# Patient Record
Sex: Female | Born: 1960 | Race: White | Hispanic: No | Marital: Married | Smoking: Never smoker
Health system: Southern US, Community
[De-identification: ages and names within clinical notes are randomized; demographics above are authoritative.]

## PROBLEM LIST (undated history)

## (undated) DIAGNOSIS — I10 Essential (primary) hypertension: Secondary | ICD-10-CM

## (undated) HISTORY — PX: TUBAL LIGATION: SHX77

## (undated) HISTORY — PX: CHOLECYSTECTOMY: SHX55

## (undated) HISTORY — PX: ABDOMINAL HYSTERECTOMY: SHX81

---

## 2001-12-03 DIAGNOSIS — D229 Melanocytic nevi, unspecified: Secondary | ICD-10-CM

## 2001-12-03 HISTORY — DX: Melanocytic nevi, unspecified: D22.9

## 2006-02-17 ENCOUNTER — Ambulatory Visit: Payer: Self-pay | Admitting: Cardiology

## 2006-02-26 ENCOUNTER — Ambulatory Visit: Payer: Self-pay | Admitting: Cardiology

## 2006-03-06 ENCOUNTER — Ambulatory Visit: Payer: Self-pay | Admitting: Cardiology

## 2008-07-19 ENCOUNTER — Ambulatory Visit (HOSPITAL_COMMUNITY): Admission: RE | Admit: 2008-07-19 | Discharge: 2008-07-19 | Payer: Self-pay | Admitting: Family Medicine

## 2009-08-08 ENCOUNTER — Telehealth (INDEPENDENT_AMBULATORY_CARE_PROVIDER_SITE_OTHER): Payer: Self-pay | Admitting: *Deleted

## 2009-11-15 ENCOUNTER — Ambulatory Visit (HOSPITAL_COMMUNITY): Admission: RE | Admit: 2009-11-15 | Discharge: 2009-11-15 | Payer: Self-pay | Admitting: Family Medicine

## 2010-07-10 NOTE — Progress Notes (Signed)
  recieved Request for records from [facility] forwarded to [facility]  August 08, 2009 9:28 AM

## 2010-11-21 ENCOUNTER — Other Ambulatory Visit (HOSPITAL_COMMUNITY): Payer: Self-pay | Admitting: Family Medicine

## 2010-11-21 DIAGNOSIS — Z139 Encounter for screening, unspecified: Secondary | ICD-10-CM

## 2010-11-30 ENCOUNTER — Ambulatory Visit (HOSPITAL_COMMUNITY): Payer: Self-pay

## 2010-12-07 ENCOUNTER — Ambulatory Visit (HOSPITAL_COMMUNITY)
Admission: RE | Admit: 2010-12-07 | Discharge: 2010-12-07 | Disposition: A | Discharge status: Home / Self Care | Payer: BC Managed Care – PPO | Source: Ambulatory Visit | Attending: Family Medicine | Admitting: Family Medicine

## 2010-12-07 DIAGNOSIS — Z1231 Encounter for screening mammogram for malignant neoplasm of breast: Secondary | ICD-10-CM | POA: Insufficient documentation

## 2010-12-07 DIAGNOSIS — Z139 Encounter for screening, unspecified: Secondary | ICD-10-CM

## 2011-12-09 ENCOUNTER — Other Ambulatory Visit (HOSPITAL_COMMUNITY): Payer: Self-pay | Admitting: Family Medicine

## 2011-12-09 DIAGNOSIS — Z139 Encounter for screening, unspecified: Secondary | ICD-10-CM

## 2011-12-20 ENCOUNTER — Ambulatory Visit (HOSPITAL_COMMUNITY)
Admission: RE | Admit: 2011-12-20 | Discharge: 2011-12-20 | Disposition: A | Discharge status: Home / Self Care | Payer: BC Managed Care – PPO | Source: Ambulatory Visit | Attending: Family Medicine | Admitting: Family Medicine

## 2011-12-20 DIAGNOSIS — Z139 Encounter for screening, unspecified: Secondary | ICD-10-CM

## 2011-12-20 DIAGNOSIS — Z1231 Encounter for screening mammogram for malignant neoplasm of breast: Secondary | ICD-10-CM | POA: Insufficient documentation

## 2012-03-31 ENCOUNTER — Ambulatory Visit: Payer: BC Managed Care – PPO | Admitting: Sports Medicine

## 2012-04-01 ENCOUNTER — Ambulatory Visit (INDEPENDENT_AMBULATORY_CARE_PROVIDER_SITE_OTHER): Payer: BC Managed Care – PPO | Admitting: Sports Medicine

## 2012-04-01 ENCOUNTER — Ambulatory Visit
Admission: RE | Admit: 2012-04-01 | Discharge: 2012-04-01 | Disposition: A | Discharge status: Home / Self Care | Payer: BC Managed Care – PPO | Source: Ambulatory Visit | Attending: Sports Medicine | Admitting: Sports Medicine

## 2012-04-01 ENCOUNTER — Encounter: Payer: Self-pay | Admitting: Sports Medicine

## 2012-04-01 VITALS — BP 161/91 | HR 74

## 2012-04-01 DIAGNOSIS — M542 Cervicalgia: Secondary | ICD-10-CM

## 2012-04-01 MED ORDER — GABAPENTIN 300 MG PO CAPS: 300.0000 mg | ORAL_CAPSULE | Freq: Every day | ORAL | Status: DC

## 2012-04-01 MED ORDER — TRAMADOL HCL 50 MG PO TABS: 50.0000 mg | ORAL_TABLET | Freq: Three times a day (TID) | ORAL | Status: DC | PRN

## 2012-04-01 NOTE — Progress Notes (Signed)
Patient ID: Kathleen Walter, female   DOB: 08/18/1960, 51 y.o.   MRN: [id number]  SUBJECTIVE: #1 - neck pain Kathleen Walter is a 51 y.o. female with PMHx of lumbar disc herniation with chief complaint of neck pain that started in mid-September at RIGHT shoulder blade, posterior neck.  After three days of pain she was given a IM gluteal injection with unknown substance, sure of 80mg  amount ( Steorid?) by PA she works with.  Pain free and improved following this injection until approx 5 days ago (last Saturday).  Since return of pain she has been using heating pad, icy-hot ointment as well as 800mg  ibuprofen qhs and 2 alleve q AM to help alleviate pain.  Pt is able to sleep at night and feels okay when she wakes up.  However, throughout the day her pain worsens; pt reports radiation of a "dull ache" sensation down posterior arm to olecranon on RIGHT.    #2 - Low back pain  6-7 year history of LBP with MRI completed previously @ [facility] which revealed herniated disc at unknown level, per patient's recollection.  Pt had spinal facet injection approx 6-7 years ago which provided some relief.  Pt reports some LBP in right paraspinal muscles, especially in mornings or after prolonged sitting/supine position.  Denies any red flag symptoms for cauda equina.  Denies increased symptoms or radiculopathy with valsalva.  Reports some radiation down right leg to heel going through right gluteus.   OBJECTIVE: Vital signs as noted above. Appearance: Well appearing female in no acute distress, appears stated age Neck/Shoulder exam: AROM and PROM is symmetric and appropriate in the neck (flexion/extion, side-bending, rotation) and shoulders (flexion/extension, IR, ER, abduction).  However, patient experiences pain with active neck flexion and side bending to the left localized to T2 area, lower cervical spine.   Pt with 5/5 strength bilaterally in abduction and sensation is grossly intact over C5  distribution.  Pt has 5/5 strength bilaterally in all major myotomes of cervical and thoracic spine as well Spasm appreciated in Right paraspinal musculature from level of C6-T3 with exquisite point tenderness at approx level of T2 on RIGHT Negative spurling's test on right Spurling's test on left (in 10 degrees extension, sidebending and rotation) reproduced pain/radiation with symptoms to deltoid but not completley to olecranon.  Low Back exam: Pt demonstrates good strength of L4, S1 nerve roots by walking on heels and toes across room.  Tandem gait is intact as well. 5/5 strength in lower extremity.  2/4 DTR of b/l achilles, patellar reflexes. Straight leg raise negative bilaterally to 70 degrees   X-ray: None new, one ordered for today  ASSESSMENT: Right sided neck pain with radiation Low Back Pain  PLAN: Pt with history and exam above demonstrate chronic low back pain, likely secondary to disc pathology per patient report.  This is stable and unchanged, measures used for cervical spine will provide relief for lumbar pain as well. Exam and history suspicious for cervical spinal nerve impingent on RIGHT side with distribution per symptoms correlating with C5 nerve root.  Strength 5/5, sensation grossly intact but reproduction of some radiation with slight pressure during Spurling's exam (looking to left). Plan for cervical spine x-ray to evaluate for impingement of C-spinal nerve(s) coming out of vertebral foramen.  Will treat pain with gabapentin 300mg  qHS and tramadol 50mg  qHS prn to assist with sleep.  Pt provided with and instructed to use C-spine collar at home, following work to alleviate stress on  cervical spine from weight of head.  Pt will be contacted back with X-ray results, she went following appointment, and follow-up in 1 month for re-evaluation and response to therapy.  -- Gerilyn Pilgrim bright, MS IV

## 2012-04-02 DIAGNOSIS — M542 Cervicalgia: Secondary | ICD-10-CM | POA: Insufficient documentation

## 2012-04-02 NOTE — Assessment & Plan Note (Signed)
We will evaluate cervical spine films  Use a cervical collar for rest  Medications to lessen pain and radicular symptoms  Recheck in one month but consider MRI if not improving

## 2012-04-29 ENCOUNTER — Ambulatory Visit (INDEPENDENT_AMBULATORY_CARE_PROVIDER_SITE_OTHER): Payer: BC Managed Care – PPO | Admitting: Sports Medicine

## 2012-04-29 VITALS — BP 138/80 | Ht 66.0 in | Wt 189.0 lb

## 2012-04-29 DIAGNOSIS — M999 Biomechanical lesion, unspecified: Secondary | ICD-10-CM | POA: Insufficient documentation

## 2012-04-29 DIAGNOSIS — M9981 Other biomechanical lesions of cervical region: Secondary | ICD-10-CM

## 2012-04-29 DIAGNOSIS — M542 Cervicalgia: Secondary | ICD-10-CM

## 2012-04-29 MED ORDER — GABAPENTIN 300 MG PO CAPS: 300.0000 mg | ORAL_CAPSULE | Freq: Every day | ORAL | Status: DC

## 2012-04-29 NOTE — Progress Notes (Signed)
History of present illness: Patient is here for followup of her neck pain. Patient was given some gabapentin and tramadol. Patient states that the tramadol gave her interesting side effects and caused her to actually have severe back pain. Patient stopped this in her back pain went away.  Patient though states that the gabapentin causes no side effects and she is tolerating it well and has no symptoms at this time. Patient states that she has not felt this good in her neck for a couple of years. patient denies any radiation in her hands or any weakness of her upper extremities. Patient is sleeping through the night and no nighttime awakening secondary to pain.  Patient did have x-ray of the neck done which did show some degenerative disc disease as well as some osteophyte formation mostly at the C4-5 region. These were reviewed by me today.  Past medical, surgical and family history reviewed without any changes  Review of systems as stated above in history of present illness  Physical exam Blood pressure 138/80, height 5\' 6"  (1.676 m), weight 189 lb (85.73 kg). General: No apparent distress alert and oriented x3 mood and affect somewhat normal but blunted. Respiratory: Patient's recent full sentences and does not appear short of breath Skin: Warm dry intact with no signs of infection or rash Neuro: Cranial nerves II through XII are intact, neurovascularly intact in all extremities with 2+ DTRs and 2+ pulses. Neck: Negative spurling's Full neck range of motion Grip strength and sensation normal in bilateral hands Strength good C4 to T1 distribution No sensory change to C4 to T1 Reflexes normal  Osteopathic findings C4 flexed rotated and side bent right T5 extended rotated and side bent left L2 flexed rotated and side bent right

## 2012-04-29 NOTE — Assessment & Plan Note (Signed)
Decision today to treat with OMT was based on Physical Exam  After verbal consent patient was treated with ME and articular techniques in cervical and thoracic areas  Patient tolerated the procedure well with improvement in symptoms  Patient given exercises, stretches and lifestyle modifications  See medications in patient instructions if given  Patient will follow up in 4-6  Weeks if beneficial

## 2012-04-29 NOTE — Patient Instructions (Signed)
Well good to see you I have refilled gabapentin and a 90 day supply.  If you need more of the gabapentin or you add a daytime dose please call. Continue doing the exercises.   At this time only come back to see [place] if you need [place].

## 2012-04-29 NOTE — Assessment & Plan Note (Addendum)
Patient appears to be doing very well just with Neurontin. At this time we'll not do any further imaging or any changes in treatment option with patient being very happy with her treatment so far. Patient is warned of red flags and when to seek medical attention and will come back on an as-needed basis.  Patient will continue to take the Neurontin as long as she does not have side effects. Patient is told though if she does start having some pain she can attempt to go up to Neurontin 2 times a day but she will give [place] a call if she does this.  Patient will come back once again on an as needed basis. She did respond to some muscle energy for her nonallopathic findings today.

## 2013-03-30 ENCOUNTER — Other Ambulatory Visit (HOSPITAL_COMMUNITY): Payer: Self-pay | Admitting: Physician Assistant

## 2013-03-30 DIAGNOSIS — Z139 Encounter for screening, unspecified: Secondary | ICD-10-CM

## 2013-04-06 ENCOUNTER — Ambulatory Visit (HOSPITAL_COMMUNITY)
Admission: RE | Admit: 2013-04-06 | Discharge: 2013-04-06 | Disposition: A | Discharge status: Home / Self Care | Payer: BC Managed Care – PPO | Source: Ambulatory Visit | Attending: Physician Assistant | Admitting: Physician Assistant

## 2013-04-06 DIAGNOSIS — Z139 Encounter for screening, unspecified: Secondary | ICD-10-CM

## 2013-04-06 DIAGNOSIS — Z1231 Encounter for screening mammogram for malignant neoplasm of breast: Secondary | ICD-10-CM | POA: Insufficient documentation

## 2013-04-08 ENCOUNTER — Telehealth (INDEPENDENT_AMBULATORY_CARE_PROVIDER_SITE_OTHER): Payer: Self-pay | Admitting: *Deleted

## 2013-04-08 NOTE — Telephone Encounter (Signed)
  Procedure: tcs  Reason/Indication:  screening  Has patient had this procedure before?  no  If so, when, by whom and where?    Is there a family history of colon cancer?  no  Who?  What age when diagnosed?    Is patient diabetic?   no      Does patient have prosthetic heart valve?  no  Do you have a pacemaker?  no  Has patient ever had endocarditis? no  Has patient had joint replacement within last 12 months?  no  Does patient tend to be constipated or take laxatives? no  Is patient on Coumadin, Plavix and/or Aspirin? no  Medications: tricor 145 mg daily, ramipril 5 mg daily  Allergies: sulfur drugs (major rash & swelling)  Medication Adjustment:   Procedure date & time:

## 2013-06-15 ENCOUNTER — Encounter (INDEPENDENT_AMBULATORY_CARE_PROVIDER_SITE_OTHER): Payer: Self-pay | Admitting: *Deleted

## 2014-06-22 ENCOUNTER — Other Ambulatory Visit (HOSPITAL_COMMUNITY): Payer: Self-pay | Admitting: Physician Assistant

## 2014-06-22 DIAGNOSIS — Z1231 Encounter for screening mammogram for malignant neoplasm of breast: Secondary | ICD-10-CM

## 2014-08-10 ENCOUNTER — Ambulatory Visit (HOSPITAL_COMMUNITY)
Admission: RE | Admit: 2014-08-10 | Discharge: 2014-08-10 | Disposition: A | Discharge status: Home / Self Care | Payer: BLUE CROSS/BLUE SHIELD | Source: Ambulatory Visit | Attending: Physician Assistant | Admitting: Physician Assistant

## 2014-08-10 DIAGNOSIS — Z1231 Encounter for screening mammogram for malignant neoplasm of breast: Secondary | ICD-10-CM | POA: Insufficient documentation

## 2015-09-20 ENCOUNTER — Other Ambulatory Visit (HOSPITAL_COMMUNITY): Payer: Self-pay | Admitting: Physician Assistant

## 2015-09-20 DIAGNOSIS — Z1231 Encounter for screening mammogram for malignant neoplasm of breast: Secondary | ICD-10-CM

## 2015-10-11 ENCOUNTER — Ambulatory Visit (HOSPITAL_COMMUNITY)
Admission: RE | Admit: 2015-10-11 | Discharge: 2015-10-11 | Disposition: A | Discharge status: Home / Self Care | Payer: BLUE CROSS/BLUE SHIELD | Source: Ambulatory Visit | Attending: Physician Assistant | Admitting: Physician Assistant

## 2015-10-11 DIAGNOSIS — Z1231 Encounter for screening mammogram for malignant neoplasm of breast: Secondary | ICD-10-CM | POA: Insufficient documentation

## 2015-10-16 ENCOUNTER — Encounter (INDEPENDENT_AMBULATORY_CARE_PROVIDER_SITE_OTHER): Payer: Self-pay | Admitting: *Deleted

## 2015-10-26 ENCOUNTER — Other Ambulatory Visit (INDEPENDENT_AMBULATORY_CARE_PROVIDER_SITE_OTHER): Payer: Self-pay | Admitting: *Deleted

## 2015-10-26 DIAGNOSIS — Z800 Family history of malignant neoplasm of digestive organs: Secondary | ICD-10-CM

## 2015-10-26 DIAGNOSIS — Z1211 Encounter for screening for malignant neoplasm of colon: Secondary | ICD-10-CM

## 2015-12-15 ENCOUNTER — Encounter (INDEPENDENT_AMBULATORY_CARE_PROVIDER_SITE_OTHER): Payer: Self-pay | Admitting: *Deleted

## 2015-12-15 ENCOUNTER — Other Ambulatory Visit (INDEPENDENT_AMBULATORY_CARE_PROVIDER_SITE_OTHER): Payer: Self-pay | Admitting: *Deleted

## 2015-12-15 NOTE — Telephone Encounter (Signed)
Patient needs trilyte 

## 2015-12-18 MED ORDER — PEG 3350-KCL-NA BICARB-NACL 420 G PO SOLR: 4000.0000 mL | Freq: Once | ORAL | Status: DC

## 2016-01-17 ENCOUNTER — Telehealth (INDEPENDENT_AMBULATORY_CARE_PROVIDER_SITE_OTHER): Payer: Self-pay | Admitting: *Deleted

## 2016-01-17 NOTE — Telephone Encounter (Signed)
Referring MD/PCP: daniel   Procedure: tcs  Reason/Indication:  Screening, fam hx colon ca  Has patient had this procedure before?  no  If so, when, by whom and where?    Is there a family history of colon cancer?  Yes, great aunt  Who?  What age when diagnosed?    Is patient diabetic?   no      Does patient have prosthetic heart valve or mechanical valve?  no  Do you have a pacemaker?  no  Has patient ever had endocarditis? no  Has patient had joint replacement within last 12 months?  no  Does patient tend to be constipated or take laxatives? no  Does patient have a history of alcohol/drug use?  no  Is patient on Coumadin, Plavix and/or Aspirin? no  Medications: lisinopril/hctz daily  Allergies: sulfur drugs  Medication Adjustment:   Procedure date & time: 02/07/16 at 930

## 2016-01-24 NOTE — Telephone Encounter (Signed)
agree

## 2016-02-07 ENCOUNTER — Encounter (HOSPITAL_COMMUNITY): Payer: Self-pay | Admitting: *Deleted

## 2016-02-07 ENCOUNTER — Encounter (HOSPITAL_COMMUNITY)
Admission: RE | Disposition: A | Discharge status: Home / Self Care | Payer: Self-pay | Source: Ambulatory Visit | Attending: Internal Medicine

## 2016-02-07 ENCOUNTER — Ambulatory Visit (HOSPITAL_COMMUNITY)
Admission: RE | Admit: 2016-02-07 | Discharge: 2016-02-07 | Disposition: A | Discharge status: Home / Self Care | Payer: BLUE CROSS/BLUE SHIELD | Source: Ambulatory Visit | Attending: Internal Medicine | Admitting: Internal Medicine

## 2016-02-07 DIAGNOSIS — I10 Essential (primary) hypertension: Secondary | ICD-10-CM | POA: Insufficient documentation

## 2016-02-07 DIAGNOSIS — Z800 Family history of malignant neoplasm of digestive organs: Secondary | ICD-10-CM

## 2016-02-07 DIAGNOSIS — K5730 Diverticulosis of large intestine without perforation or abscess without bleeding: Secondary | ICD-10-CM | POA: Diagnosis not present

## 2016-02-07 DIAGNOSIS — K644 Residual hemorrhoidal skin tags: Secondary | ICD-10-CM | POA: Insufficient documentation

## 2016-02-07 DIAGNOSIS — Z1211 Encounter for screening for malignant neoplasm of colon: Secondary | ICD-10-CM | POA: Insufficient documentation

## 2016-02-07 HISTORY — PX: COLONOSCOPY: SHX5424

## 2016-02-07 HISTORY — DX: Essential (primary) hypertension: I10

## 2016-02-07 SURGERY — COLONOSCOPY
Anesthesia: Moderate Sedation

## 2016-02-07 MED ORDER — SODIUM CHLORIDE 0.9 % IV SOLN
INTRAVENOUS | Status: DC
  Administered 2016-02-07: 09:00:00 via INTRAVENOUS

## 2016-02-07 MED ORDER — MEPERIDINE HCL 50 MG/ML IJ SOLN
INTRAMUSCULAR | Status: DC | PRN
  Administered 2016-02-07 (×2): 25 mg via INTRAVENOUS

## 2016-02-07 MED ORDER — MIDAZOLAM HCL 5 MG/5ML IJ SOLN
INTRAMUSCULAR | Status: AC
  Filled 2016-02-07: qty 10

## 2016-02-07 MED ORDER — SIMETHICONE 40 MG/0.6ML PO SUSP
ORAL | Status: DC | PRN
  Administered 2016-02-07: 2.5 mL

## 2016-02-07 MED ORDER — DICYCLOMINE HCL 10 MG PO CAPS: 10.0000 mg | ORAL_CAPSULE | Freq: Three times a day (TID) | ORAL | 2 refills | Status: DC | PRN

## 2016-02-07 MED ORDER — MEPERIDINE HCL 50 MG/ML IJ SOLN
INTRAMUSCULAR | Status: AC
  Filled 2016-02-07: qty 1

## 2016-02-07 MED ORDER — MIDAZOLAM HCL 5 MG/5ML IJ SOLN
INTRAMUSCULAR | Status: DC | PRN
  Administered 2016-02-07: 2 mg via INTRAVENOUS
  Administered 2016-02-07: 1 mg via INTRAVENOUS
  Administered 2016-02-07 (×3): 2 mg via INTRAVENOUS

## 2016-02-07 NOTE — Op Note (Signed)
[facility] Patient Name: Kathleen Walter Procedure Date: 02/07/2016 9:17 AM MRN: [id number] Date of Birth: [birth year] Attending MD: Hildred Laser , MD CSN: [id number] Age: 55 Admit Type: Outpatient Procedure:                Colonoscopy Indications:              Screening for colorectal malignant neoplasm Providers:                Hildred Laser, MD, Lurline Del, RN, Purcell Nails. [place],                            Merchant navy officer Referring MD:             Tawni Carnes, PA Medicines:                Meperidine 50 mg IV, Midazolam 9 mg IV Complications:            No immediate complications. Estimated Blood Loss:     Estimated blood loss: none. Procedure:                Pre-Anesthesia Assessment:                           - Prior to the procedure, a History and Physical                            was performed, and patient medications and                            allergies were reviewed. The patient's tolerance of                            previous anesthesia was also reviewed. The risks                            and benefits of the procedure and the sedation                            options and risks were discussed with the patient.                            All questions were answered, and informed consent                            was obtained. Prior Anticoagulants: The patient                            last took ibuprofen 14 days prior to the procedure.                            ASA Grade Assessment: I - A normal, healthy                            patient. After reviewing the risks and benefits,  the patient was deemed in satisfactory condition to                            undergo the procedure.                           After obtaining informed consent, the colonoscope                            was passed under direct vision. Throughout the                            procedure, the patient's blood pressure, pulse, and   oxygen saturations were monitored continuously. The                            [id number]) was introduced through the                            anus and advanced to the the cecum, identified by                            appendiceal orifice and ileocecal valve. The                            colonoscopy was somewhat difficult due to                            significant looping. Successful completion of the                            procedure was aided by changing the patient to a                            supine position. The patient tolerated the                            procedure well. The quality of the bowel                            preparation was good. The ileocecal valve,                            appendiceal orifice, and rectum were photographed. Scope In: 9:33:54 AM Scope Out: 10:01:27 AM Scope Withdrawal Time: 0 hours 7 minutes 34 seconds  Total Procedure Duration: 0 hours 27 minutes 33 seconds  Findings:      Scattered medium-mouthed diverticula were found in the sigmoid colon and       hepatic flexure.      External hemorrhoids were found during retroflexion. The hemorrhoids       were small. Impression:               - Diverticulosis in the sigmoid colon and at the  hepatic flexure.                           - External hemorrhoids.                           - No specimens collected. Moderate Sedation:      Moderate (conscious) sedation was administered by the endoscopy nurse       and supervised by the endoscopist. The following parameters were       monitored: oxygen saturation, heart rate, blood pressure, CO2       capnography and response to care. Total physician intraservice time was       35 minutes. Recommendation:           - Patient has a contact number available for                            emergencies. The signs and symptoms of potential                            delayed complications were discussed with the                             patient. Return to normal activities tomorrow.                            Written discharge instructions were provided to the                            patient.                           - High fiber diet today.                           - Continue present medications.                           - Repeat colonoscopy in 10 years for screening                            purposes. Procedure Code(s):        --- Professional ---                           [phone], Colonoscopy, flexible; diagnostic, including                            collection of specimen(s) by brushing or washing,                            when performed (separate procedure)                           99152, Moderate sedation services provided by the  same physician or other qualified health care                            professional performing the diagnostic or                            therapeutic service that the sedation supports,                            requiring the presence of an independent trained                            observer to assist in the monitoring of the                            patient's level of consciousness and physiological                            status; initial 15 minutes of intraservice time,                            patient age 9 years or older                           [id number], Moderate sedation services; each additional                            15 minutes intraservice time Diagnosis Code(s):        --- Professional ---                           Z12.11, Encounter for screening for malignant                            neoplasm of colon                           K64.4, Residual hemorrhoidal skin tags                           K57.30, Diverticulosis of large intestine without                            perforation or abscess without bleeding CPT copyright 2016 American Medical Association. All rights reserved. The codes documented in this  report are preliminary and upon coder review may  be revised to meet current compliance requirements. Hildred Laser, MD Hildred Laser, MD 02/07/2016 10:14:48 AM This report has been signed electronically. Number of Addenda: 0

## 2016-02-07 NOTE — H&P (Signed)
Kathleen Walter is an 55 y.o. female.   Chief Complaint: Patient is here for colonoscopy. HPI: Patient is 55 year old Caucasian female who is here for screening colonoscopy. She denies abdominal pain change in bowel habits or rectal bleeding. Family history is negative for CRC.  Past Medical History:  Diagnosis Date  . Hypertension     Past Surgical History:  Procedure Laterality Date  . ABDOMINAL HYSTERECTOMY    . CHOLECYSTECTOMY    . TUBAL LIGATION      History reviewed. No pertinent family history. Social History:  reports that she has never smoked. She has never used smokeless tobacco. Her alcohol and drug histories are not on file.  Allergies:  Allergies  Allergen Reactions  . Sulfa Antibiotics Swelling and Rash    Medications Prior to Admission  Medication Sig Dispense Refill  . ibuprofen (ADVIL,MOTRIN) 200 MG tablet Take 600 mg by mouth every 6 (six) hours as needed for moderate pain.    Marland Kitchen lisinopril-hydrochlorothiazide (PRINZIDE,ZESTORETIC) 20-12.5 MG tablet Take 1 tablet by mouth daily.      No results found for this or any previous visit (from the past 48 hour(s)). No results found.  ROS  Blood pressure 126/75, pulse 100, temperature 97.9 F (36.6 C), temperature source Oral, resp. rate 12, height 5' 5.5" (1.664 m), weight 188 lb (85.3 kg), SpO2 100 %. Physical Exam  Constitutional: She appears well-developed and well-nourished.  HENT:  Mouth/Throat: Oropharynx is clear and moist.  Eyes: Conjunctivae are normal.  Neck: No thyromegaly present.  Cardiovascular: Normal rate, regular rhythm and normal heart sounds.   No murmur heard. Respiratory: Effort normal and breath sounds normal.  GI: Soft. She exhibits no distension and no mass. There is no tenderness.  Musculoskeletal: She exhibits no edema.  Lymphadenopathy:    She has no cervical adenopathy.  Neurological: She is alert.  Skin: Skin is warm and dry.     Assessment/Plan Average risk screening  colonoscopy.  Hildred Laser, MD 02/07/2016, 9:19 AM

## 2016-02-07 NOTE — Discharge Instructions (Signed)
Resume usual medications and high fiber diet. Dicyclomine 10 mg by mouth 30 minutes before each meal as needed. No driving for 24 hours. Next screening exam in 10 years.     Colonoscopy, Care After These instructions give you information on caring for yourself after your procedure. Your doctor may also give you more specific instructions. Call your doctor if you have any problems or questions after your procedure. HOME CARE  Do not drive for 24 hours.  Do not sign important papers or use machinery for 24 hours.  You may shower.  You may go back to your usual activities, but go slower for the first 24 hours.  Take rest breaks often during the first 24 hours.  Walk around or use warm packs on your belly (abdomen) if you have belly cramping or gas.  Drink enough fluids to keep your pee (urine) clear or pale yellow.  Resume your normal diet. Avoid heavy or fried foods.  Avoid drinking alcohol for 24 hours or as told by your doctor.  Only take medicines as told by your doctor. If a tissue sample (biopsy) was taken during the procedure:   Do not take aspirin or blood thinners for 7 days, or as told by your doctor.  Do not drink alcohol for 7 days, or as told by your doctor.  Eat soft foods for the first 24 hours. GET HELP IF: You still have a small amount of blood in your poop (stool) 2-3 days after the procedure. GET HELP RIGHT AWAY IF:  You have more than a small amount of blood in your poop.  You see clumps of tissue (blood clots) in your poop.  Your belly is puffy (swollen).  You feel sick to your stomach (nauseous) or throw up (vomit).  You have a fever.  You have belly pain that gets worse and medicine does not help. MAKE SURE YOU:  Understand these instructions.  Will watch your condition.  Will get help right away if you are not doing well or get worse.   This information is not intended to replace advice given to you by your health care provider. Make  sure you discuss any questions you have with your health care provider.   Document Released: 06/29/2010 Document Revised: 06/01/2013 Document Reviewed: 02/01/2013 Elsevier Interactive Patient Education 2016 Elsevier Inc.   High-Fiber Diet Fiber, also called dietary fiber, is a type of carbohydrate found in fruits, vegetables, whole grains, and beans. A high-fiber diet can have many health benefits. Your health care provider may recommend a high-fiber diet to help:  Prevent constipation. Fiber can make your bowel movements more regular.  Lower your cholesterol.  Relieve hemorrhoids, uncomplicated diverticulosis, or irritable bowel syndrome.  Prevent overeating as part of a weight-loss plan.  Prevent heart disease, type 2 diabetes, and certain cancers. WHAT IS MY PLAN? The recommended daily intake of fiber includes:  38 grams for men under age 60.  46 grams for men over age 64.  59 grams for women under age 67.  59 grams for women over age 61. You can get the recommended daily intake of dietary fiber by eating a variety of fruits, vegetables, grains, and beans. Your health care provider may also recommend a fiber supplement if it is not possible to get enough fiber through your diet. WHAT DO I NEED TO KNOW ABOUT A HIGH-FIBER DIET?  Fiber supplements have not been widely studied for their effectiveness, so it is better to get fiber through food sources.  Always check the fiber content on thenutrition facts label of any prepackaged food. Look for foods that contain at least 5 grams of fiber per serving.  Ask your dietitian if you have questions about specific foods that are related to your condition, especially if those foods are not listed in the following section.  Increase your daily fiber consumption gradually. Increasing your intake of dietary fiber too quickly may cause bloating, cramping, or gas.  Drink plenty of water. Water helps you to digest fiber. WHAT FOODS CAN I  EAT? Grains Whole-grain breads. Multigrain cereal. Oats and oatmeal. Brown rice. Barley. Bulgur wheat. [place]. Bran muffins. Popcorn. Rye wafer crackers. Vegetables Sweet potatoes. Spinach. Kale. Artichokes. Cabbage. Broccoli. Green peas. Carrots. Squash. Fruits Berries. Pears. Apples. Oranges. Avocados. Prunes and raisins. Dried figs. Meats and Other Protein Sources [place], kidney, pinto, and soy beans. Split peas. Lentils. Nuts and seeds. Dairy Fiber-fortified yogurt. Beverages Fiber-fortified soy milk. Fiber-fortified orange juice. Other Fiber bars. The items listed above may not be a complete list of recommended foods or beverages. Contact your dietitian for more options. WHAT FOODS ARE NOT RECOMMENDED? Grains White bread. Pasta made with refined flour. White rice. Vegetables Fried potatoes. Canned vegetables. Well-cooked vegetables.  Fruits Fruit juice. Cooked, strained fruit. Meats and Other Protein Sources Fatty cuts of meat. Fried Sales executive or fried fish. Dairy Milk. Yogurt. Cream cheese. Sour cream. Beverages Soft drinks. Other Cakes and pastries. Butter and oils. The items listed above may not be a complete list of foods and beverages to avoid. Contact your dietitian for more information. WHAT ARE SOME TIPS FOR INCLUDING HIGH-FIBER FOODS IN MY DIET?  Eat a wide variety of high-fiber foods.  Make sure that half of all grains consumed each day are whole grains.  Replace breads and cereals made from refined flour or white flour with whole-grain breads and cereals.  Replace white rice with brown rice, bulgur wheat, or millet.  Start the day with a breakfast that is high in fiber, such as a cereal that contains at least 5 grams of fiber per serving.  Use beans in place of meat in soups, salads, or pasta.  Eat high-fiber snacks, such as berries, raw vegetables, nuts, or popcorn.   This information is not intended to replace advice given to you by your health care  provider. Make sure you discuss any questions you have with your health care provider.   Document Released: 05/27/2005 Document Revised: 06/17/2014 Document Reviewed: 11/09/2013 Elsevier Interactive Patient Education [employer].

## 2016-02-13 ENCOUNTER — Encounter (HOSPITAL_COMMUNITY): Payer: Self-pay | Admitting: Internal Medicine

## 2016-12-04 ENCOUNTER — Other Ambulatory Visit (HOSPITAL_COMMUNITY): Payer: Self-pay | Admitting: Physician Assistant

## 2016-12-04 DIAGNOSIS — Z1231 Encounter for screening mammogram for malignant neoplasm of breast: Secondary | ICD-10-CM

## 2016-12-05 ENCOUNTER — Ambulatory Visit (HOSPITAL_COMMUNITY)
Admission: RE | Admit: 2016-12-05 | Discharge: 2016-12-05 | Disposition: A | Discharge status: Home / Self Care | Payer: BLUE CROSS/BLUE SHIELD | Source: Ambulatory Visit | Attending: Physician Assistant | Admitting: Physician Assistant

## 2016-12-05 DIAGNOSIS — Z1231 Encounter for screening mammogram for malignant neoplasm of breast: Secondary | ICD-10-CM | POA: Insufficient documentation

## 2017-02-11 ENCOUNTER — Other Ambulatory Visit (INDEPENDENT_AMBULATORY_CARE_PROVIDER_SITE_OTHER): Payer: Self-pay | Admitting: Internal Medicine

## 2017-12-10 ENCOUNTER — Other Ambulatory Visit (HOSPITAL_COMMUNITY): Payer: Self-pay | Admitting: Physician Assistant

## 2017-12-10 DIAGNOSIS — Z1231 Encounter for screening mammogram for malignant neoplasm of breast: Secondary | ICD-10-CM

## 2018-01-05 ENCOUNTER — Ambulatory Visit (HOSPITAL_COMMUNITY): Payer: BLUE CROSS/BLUE SHIELD

## 2018-02-04 ENCOUNTER — Ambulatory Visit (HOSPITAL_COMMUNITY)
Admission: RE | Admit: 2018-02-04 | Discharge: 2018-02-04 | Disposition: A | Discharge status: Home / Self Care | Payer: BLUE CROSS/BLUE SHIELD | Source: Ambulatory Visit | Attending: Physician Assistant | Admitting: Physician Assistant

## 2018-02-04 DIAGNOSIS — Z1231 Encounter for screening mammogram for malignant neoplasm of breast: Secondary | ICD-10-CM | POA: Diagnosis present

## 2019-03-02 ENCOUNTER — Other Ambulatory Visit (HOSPITAL_COMMUNITY): Payer: Self-pay | Admitting: Physician Assistant

## 2019-03-02 DIAGNOSIS — Z1231 Encounter for screening mammogram for malignant neoplasm of breast: Secondary | ICD-10-CM

## 2019-03-11 ENCOUNTER — Ambulatory Visit (HOSPITAL_COMMUNITY)
Admission: RE | Admit: 2019-03-11 | Discharge: 2019-03-11 | Disposition: A | Discharge status: Home / Self Care | Payer: BC Managed Care – PPO | Source: Ambulatory Visit | Attending: Physician Assistant | Admitting: Physician Assistant

## 2019-03-11 ENCOUNTER — Other Ambulatory Visit: Payer: Self-pay

## 2019-03-11 DIAGNOSIS — Z1231 Encounter for screening mammogram for malignant neoplasm of breast: Secondary | ICD-10-CM | POA: Insufficient documentation

## 2020-07-05 ENCOUNTER — Other Ambulatory Visit (HOSPITAL_COMMUNITY): Payer: Self-pay | Admitting: Physician Assistant

## 2020-07-05 DIAGNOSIS — Z1231 Encounter for screening mammogram for malignant neoplasm of breast: Secondary | ICD-10-CM

## 2020-07-13 ENCOUNTER — Other Ambulatory Visit: Payer: Self-pay

## 2020-07-13 ENCOUNTER — Ambulatory Visit (HOSPITAL_COMMUNITY)
Admission: RE | Admit: 2020-07-13 | Discharge: 2020-07-13 | Disposition: A | Discharge status: Home / Self Care | Payer: BC Managed Care – PPO | Source: Ambulatory Visit | Attending: Physician Assistant | Admitting: Physician Assistant

## 2020-07-13 DIAGNOSIS — Z1231 Encounter for screening mammogram for malignant neoplasm of breast: Secondary | ICD-10-CM | POA: Diagnosis not present

## 2020-08-01 ENCOUNTER — Encounter: Payer: Self-pay | Admitting: Physician Assistant

## 2020-08-01 ENCOUNTER — Ambulatory Visit (INDEPENDENT_AMBULATORY_CARE_PROVIDER_SITE_OTHER): Payer: BC Managed Care – PPO | Admitting: Physician Assistant

## 2020-08-01 ENCOUNTER — Other Ambulatory Visit: Payer: Self-pay

## 2020-08-01 DIAGNOSIS — D2262 Melanocytic nevi of left upper limb, including shoulder: Secondary | ICD-10-CM

## 2020-08-01 DIAGNOSIS — Z1283 Encounter for screening for malignant neoplasm of skin: Secondary | ICD-10-CM

## 2020-08-01 DIAGNOSIS — Z85828 Personal history of other malignant neoplasm of skin: Secondary | ICD-10-CM

## 2020-08-01 DIAGNOSIS — D485 Neoplasm of uncertain behavior of skin: Secondary | ICD-10-CM | POA: Diagnosis not present

## 2020-08-01 DIAGNOSIS — D2271 Melanocytic nevi of right lower limb, including hip: Secondary | ICD-10-CM

## 2020-08-01 NOTE — Patient Instructions (Signed)

## 2020-08-07 ENCOUNTER — Encounter: Payer: Self-pay | Admitting: *Deleted

## 2020-08-07 ENCOUNTER — Telehealth: Payer: Self-pay | Admitting: *Deleted

## 2020-08-07 NOTE — Telephone Encounter (Signed)
Pathology to patient- surgery appointment scheduled for wider shave x 2.

## 2020-08-07 NOTE — Telephone Encounter (Signed)
-----   Message from Warren Danes, [place] sent at 08/07/2020  9:32 AM EST ----- 2 WS in 6 mo

## 2020-08-28 ENCOUNTER — Encounter: Payer: Self-pay | Admitting: Physician Assistant

## 2020-08-28 NOTE — Progress Notes (Signed)
   Follow-Up Visit   Subjective  Kathleen Walter is a 60 y.o. female who presents for the following: Annual Exam (Patient has mole on back right thigh has become darker./Patient thinks she has milia on left side of face.).   The following portions of the chart were reviewed this encounter and updated as appropriate:      Objective  Well appearing patient in no apparent distress; mood and affect are within normal limits.  A full examination was performed including scalp, head, eyes, ears, nose, lips, neck, chest, axillae, abdomen, back, buttocks, bilateral upper extremities, bilateral lower extremities, hands, feet, fingers, toes, fingernails, and toenails. All findings within normal limits unless otherwise noted below.  Objective  Left Forehead: White scar- clear  Objective  Right Abdomen (side) - Upper: Full body skin check  Objective  Left Upper Arm - Anterior: Dark brown macule     Objective  Right Thigh - Posterior: Pink pearly papule     Objective  Left Buccal Cheek: Pink pearly papule      Assessment & Plan  History of basal cell cancer Left Forehead  Yearly skin check  Encounter for screening for malignant neoplasm of skin Right Abdomen (side) - Upper  Yearly skin check  Neoplasm of uncertain behavior of skin (3) Left Upper Arm - Anterior  Skin / nail biopsy Type of biopsy: tangential   Informed consent: discussed and consent obtained   Timeout: patient name, date of birth, surgical site, and procedure verified   Procedure prep:  Patient was prepped and draped in usual sterile fashion (Non sterile) Prep type:  Chlorhexidine Anesthesia: the lesion was anesthetized in a standard fashion   Anesthetic:  1% lidocaine w/ epinephrine 1-100,000 local infiltration Instrument used: flexible razor blade   Outcome: patient tolerated procedure well   Post-procedure details: wound care instructions given    Specimen 1 - Surgical pathology Differential  Diagnosis: r/o atypia  Check Margins: No  Right Thigh - Posterior  Skin / nail biopsy Type of biopsy: tangential   Informed consent: discussed and consent obtained   Timeout: patient name, date of birth, surgical site, and procedure verified   Procedure prep:  Patient was prepped and draped in usual sterile fashion (Non sterile) Prep type:  Chlorhexidine Anesthesia: the lesion was anesthetized in a standard fashion   Anesthetic:  1% lidocaine w/ epinephrine 1-100,000 local infiltration Instrument used: flexible razor blade   Outcome: patient tolerated procedure well   Post-procedure details: wound care instructions given    Specimen 2 - Surgical pathology Differential Diagnosis: r/o atypia  Check Margins: No  Left Buccal Cheek  Skin / nail biopsy Type of biopsy: tangential   Informed consent: discussed and consent obtained   Timeout: patient name, date of birth, surgical site, and procedure verified   Procedure prep:  Patient was prepped and draped in usual sterile fashion (Non sterile) Prep type:  Chlorhexidine Anesthesia: the lesion was anesthetized in a standard fashion   Anesthetic:  1% lidocaine w/ epinephrine 1-100,000 local infiltration Instrument used: flexible razor blade   Outcome: patient tolerated procedure well   Post-procedure details: wound care instructions given    Specimen 3 - Surgical pathology Differential Diagnosis: bcc vs scc  Check Margins: No    I, Bellanie Matthew, PA-C, have reviewed all documentation's for this visit.  The documentation on 08/28/20 for the exam, diagnosis, procedures and orders are all accurate and complete.

## 2021-01-10 ENCOUNTER — Ambulatory Visit (INDEPENDENT_AMBULATORY_CARE_PROVIDER_SITE_OTHER): Payer: BC Managed Care – PPO | Admitting: Physician Assistant

## 2021-01-10 ENCOUNTER — Encounter: Payer: Self-pay | Admitting: Physician Assistant

## 2021-01-10 ENCOUNTER — Other Ambulatory Visit: Payer: Self-pay

## 2021-01-10 DIAGNOSIS — D229 Melanocytic nevi, unspecified: Secondary | ICD-10-CM

## 2021-01-10 DIAGNOSIS — D2271 Melanocytic nevi of right lower limb, including hip: Secondary | ICD-10-CM | POA: Diagnosis not present

## 2021-01-10 DIAGNOSIS — D2262 Melanocytic nevi of left upper limb, including shoulder: Secondary | ICD-10-CM

## 2021-01-10 NOTE — Progress Notes (Signed)
   Follow-Up Visit   Subjective  Kathleen Walter is a 60 y.o. female who presents for the following: Procedure (Patient here today for wider shave x 2 ).   The following portions of the chart were reviewed this encounter and updated as appropriate:  Tobacco  Allergies  Meds  Problems  Med Hx  Surg Hx  Fam Hx      Objective  Well appearing patient in no apparent distress; mood and affect are within normal limits.  A focused examination was performed including upper and lower extremities. Relevant physical exam findings are noted in the Assessment and Plan.  Left Upper Arm - Anterior, Right Thigh - Posterior Pink macules   Assessment & Plan  Atypical mole (2) Left Upper Arm - Anterior; Right Thigh - Posterior  Epidermal / dermal shaving - Left Upper Arm - Anterior  Lesion diameter (cm):  0.8 Informed consent: discussed and consent obtained   Timeout: patient name, date of birth, surgical site, and procedure verified   Procedure prep:  Patient was prepped and draped in usual sterile fashion Prep type:  Chlorhexidine Anesthesia: the lesion was anesthetized in a standard fashion   Anesthetic:  1% lidocaine w/ epinephrine 1-100,000 local infiltration Instrument used: DermaBlade   Hemostasis achieved with: aluminum chloride   Outcome: patient tolerated procedure well   Post-procedure details: sterile dressing applied and wound care instructions given   Dressing type: petrolatum gauze, petrolatum and bandage    Epidermal / dermal shaving - Right Thigh - Posterior  Lesion diameter (cm):  1.8 Informed consent: discussed and consent obtained   Timeout: patient name, date of birth, surgical site, and procedure verified   Procedure prep:  Patient was prepped and draped in usual sterile fashion Prep type:  Chlorhexidine Anesthesia: the lesion was anesthetized in a standard fashion   Anesthetic:  1% lidocaine w/ epinephrine 1-100,000 nerve block Instrument used: flexible razor  blade   Hemostasis achieved with: aluminum chloride   Outcome: patient tolerated procedure well   Post-procedure details: wound care instructions given    Specimen 1 - Surgical pathology Differential Diagnosis: moderate [id number] Check Margins: yes  Specimen 2 - Surgical pathology Differential Diagnosis: moderate [id number] Check Margins: yes    I, Monee Dembeck, PA-C, have reviewed all documentation's for this visit.  The documentation on 01/10/21 for the exam, diagnosis, procedures and orders are all accurate and complete.

## 2021-01-10 NOTE — Patient Instructions (Signed)

## 2021-01-18 ENCOUNTER — Telehealth: Payer: Self-pay | Admitting: *Deleted

## 2021-01-18 NOTE — Telephone Encounter (Signed)
-----   Message from Warren Danes, [place] sent at 01/18/2021  9:09 AM EDT ----- Rtc if recurs.

## 2021-01-18 NOTE — Telephone Encounter (Signed)
Pathology to patient.  °

## 2021-06-20 ENCOUNTER — Ambulatory Visit: Payer: BC Managed Care – PPO | Admitting: Physician Assistant

## 2021-07-03 ENCOUNTER — Ambulatory Visit: Payer: BC Managed Care – PPO | Admitting: Physician Assistant

## 2021-08-15 ENCOUNTER — Other Ambulatory Visit (HOSPITAL_COMMUNITY): Payer: Self-pay | Admitting: Physician Assistant

## 2021-08-15 DIAGNOSIS — Z1231 Encounter for screening mammogram for malignant neoplasm of breast: Secondary | ICD-10-CM

## 2021-08-23 ENCOUNTER — Other Ambulatory Visit: Payer: Self-pay

## 2021-08-23 ENCOUNTER — Ambulatory Visit (HOSPITAL_COMMUNITY)
Admission: RE | Admit: 2021-08-23 | Discharge: 2021-08-23 | Disposition: A | Discharge status: Home / Self Care | Payer: BC Managed Care – PPO | Source: Ambulatory Visit | Attending: Physician Assistant | Admitting: Physician Assistant

## 2021-08-23 DIAGNOSIS — Z1231 Encounter for screening mammogram for malignant neoplasm of breast: Secondary | ICD-10-CM | POA: Diagnosis present

## 2022-01-10 ENCOUNTER — Ambulatory Visit: Payer: BC Managed Care – PPO | Admitting: Physician Assistant

## 2022-01-23 ENCOUNTER — Telehealth: Payer: Self-pay | Admitting: *Deleted

## 2022-01-23 MED ORDER — MUPIROCIN 2 % EX OINT: 1.0000 | TOPICAL_OINTMENT | Freq: Two times a day (BID) | CUTANEOUS | 10 refills | Status: AC

## 2022-01-23 MED ORDER — TRIAMCINOLONE ACETONIDE 0.1 % EX CREA: 1.0000 | TOPICAL_CREAM | Freq: Every day | CUTANEOUS | 10 refills | Status: AC | PRN

## 2022-01-23 NOTE — Telephone Encounter (Signed)
Phone call from patient needing refills on medication.

## 2022-08-06 ENCOUNTER — Other Ambulatory Visit (HOSPITAL_COMMUNITY): Payer: Self-pay | Admitting: Physician Assistant

## 2022-08-06 DIAGNOSIS — Z1231 Encounter for screening mammogram for malignant neoplasm of breast: Secondary | ICD-10-CM

## 2022-08-28 ENCOUNTER — Encounter (HOSPITAL_COMMUNITY): Payer: Self-pay

## 2022-08-28 ENCOUNTER — Ambulatory Visit (HOSPITAL_COMMUNITY)
Admission: RE | Admit: 2022-08-28 | Discharge: 2022-08-28 | Disposition: A | Discharge status: Home / Self Care | Payer: BC Managed Care – PPO | Source: Ambulatory Visit | Attending: Physician Assistant | Admitting: Physician Assistant

## 2022-08-28 DIAGNOSIS — Z1231 Encounter for screening mammogram for malignant neoplasm of breast: Secondary | ICD-10-CM | POA: Diagnosis present

## 2022-10-08 IMAGING — MG MM DIGITAL SCREENING BILAT W/ TOMO AND CAD
8 series · 8 of 24 positions shown · non-contrast
Comparison: Previous exam(s).

CLINICAL DATA: Screening.

EXAM:
DIGITAL SCREENING BILATERAL MAMMOGRAM WITH TOMOSYNTHESIS AND CAD

[R CC synth-2D]
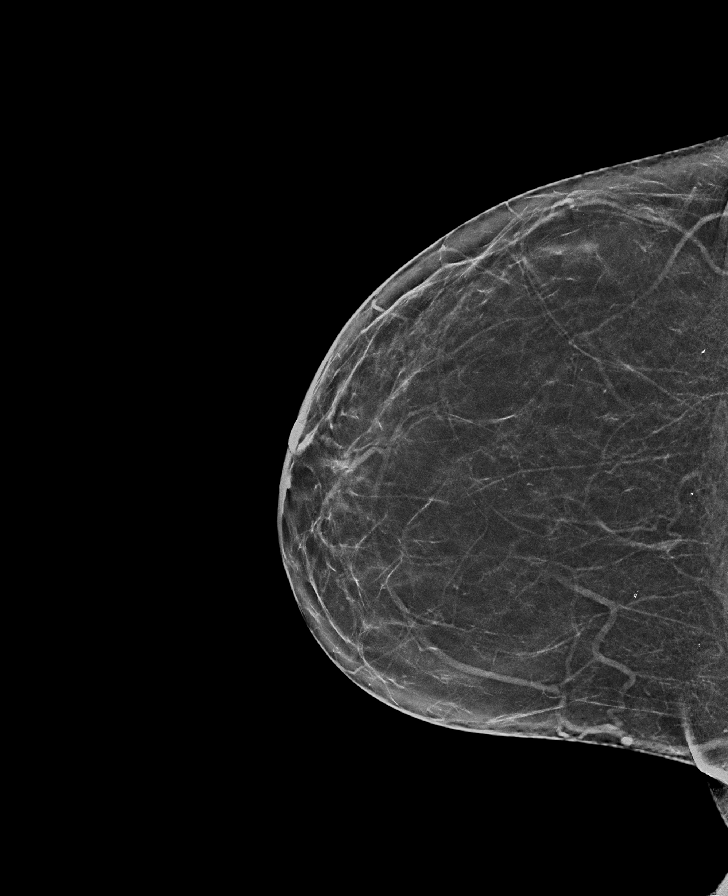

[L MLO synth-2D]
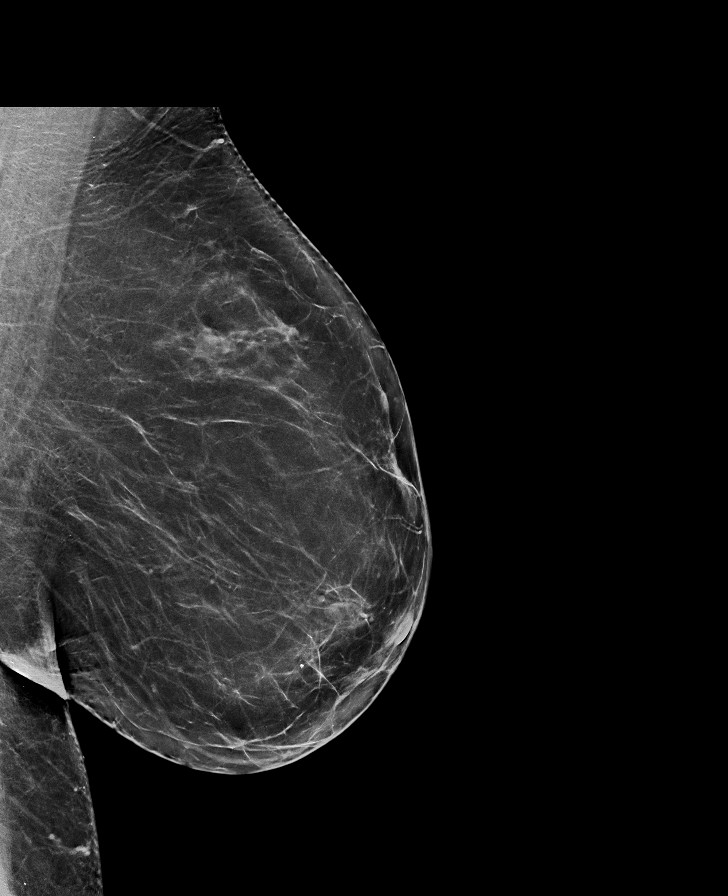

[R MLO synth-2D]
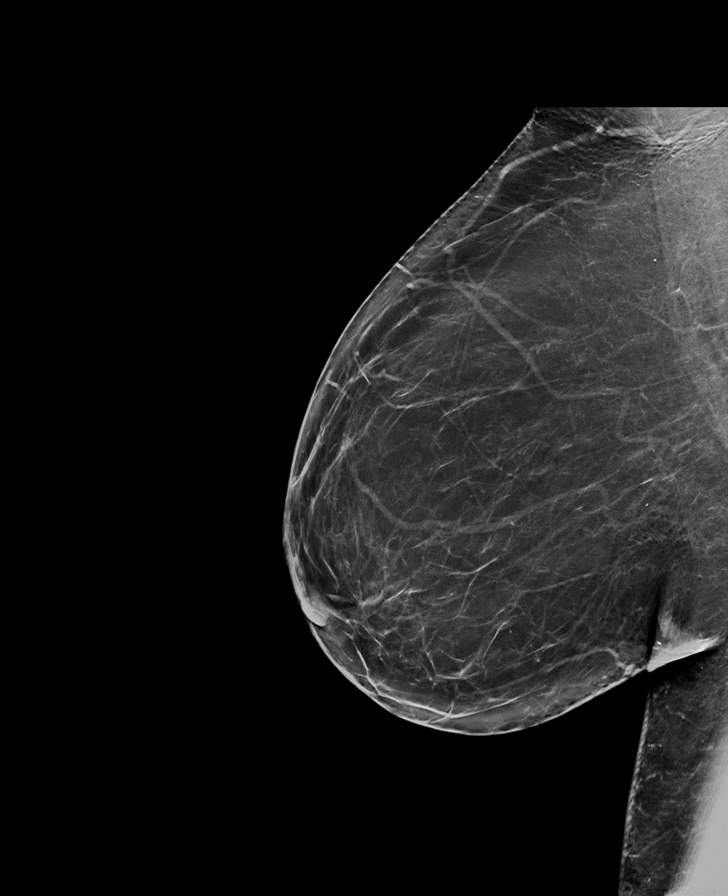

[L CC synth-2D]
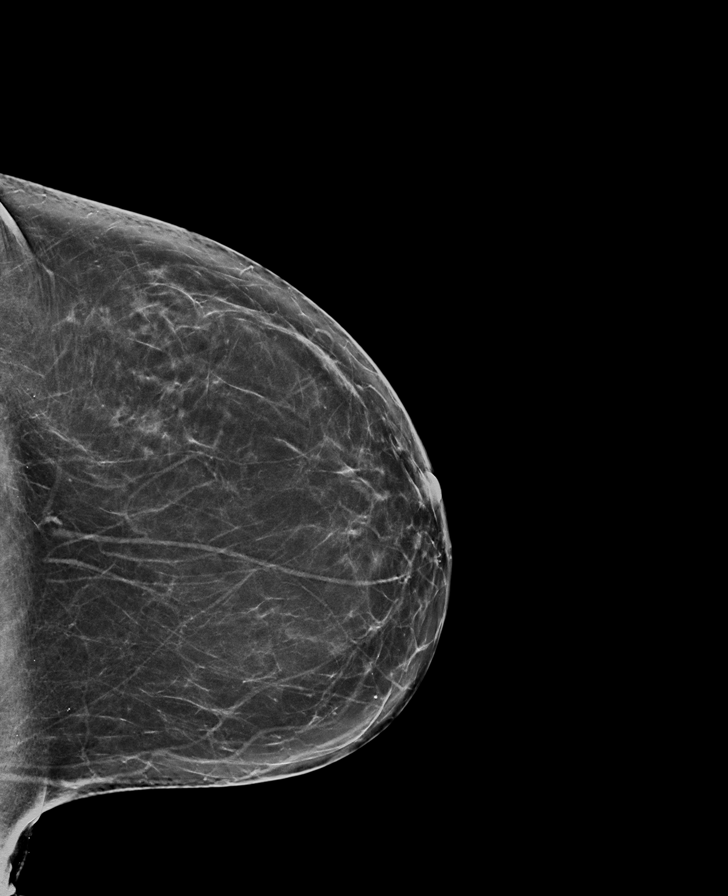

[R CC tomo · tomo slice 31/62.0]
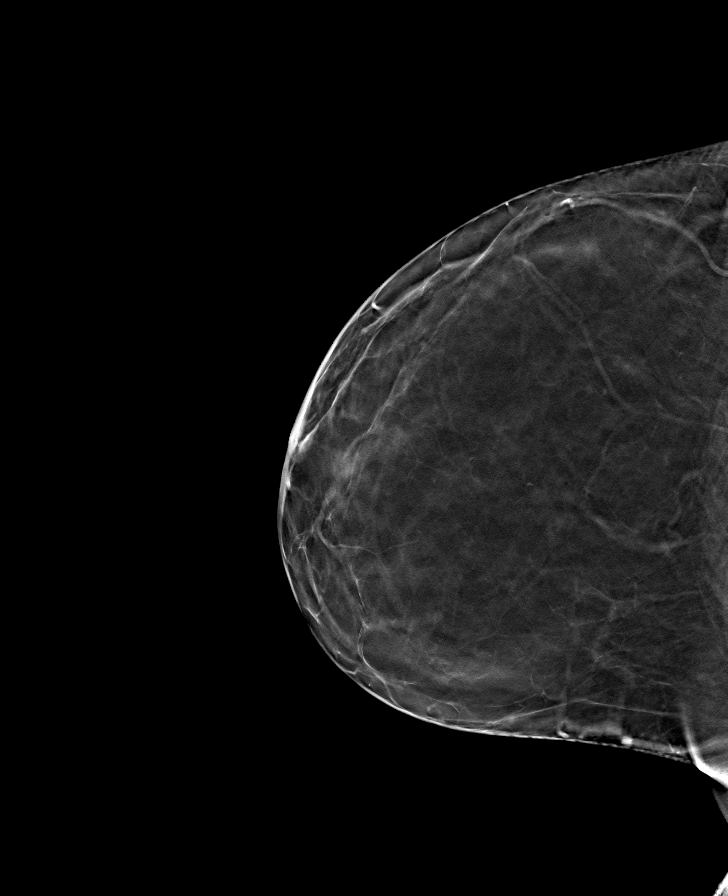

[L MLO tomo · tomo slice 38/75.0]
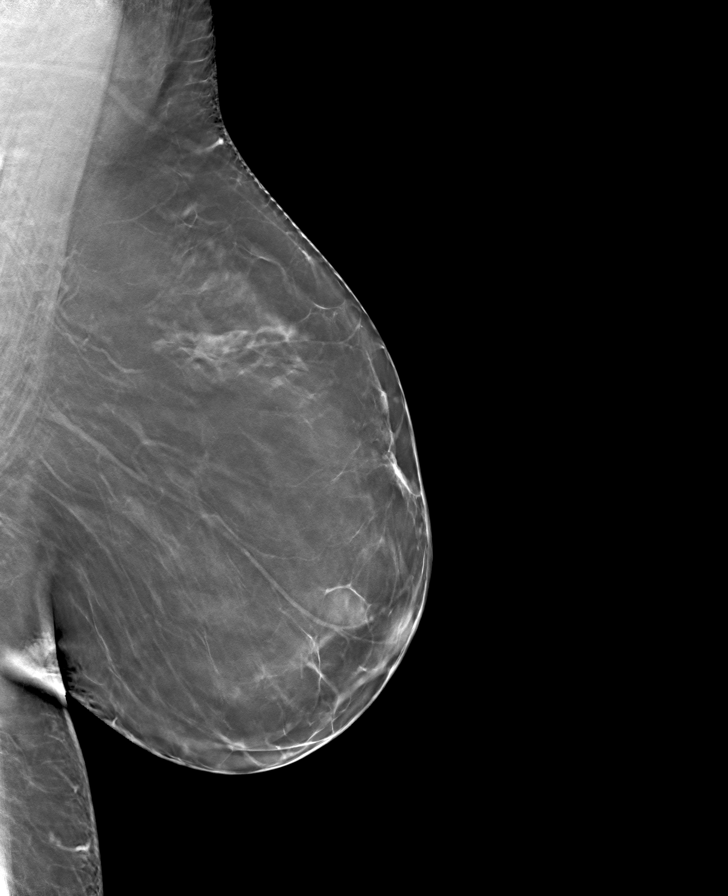

[L CC tomo · tomo slice 35/70.0]
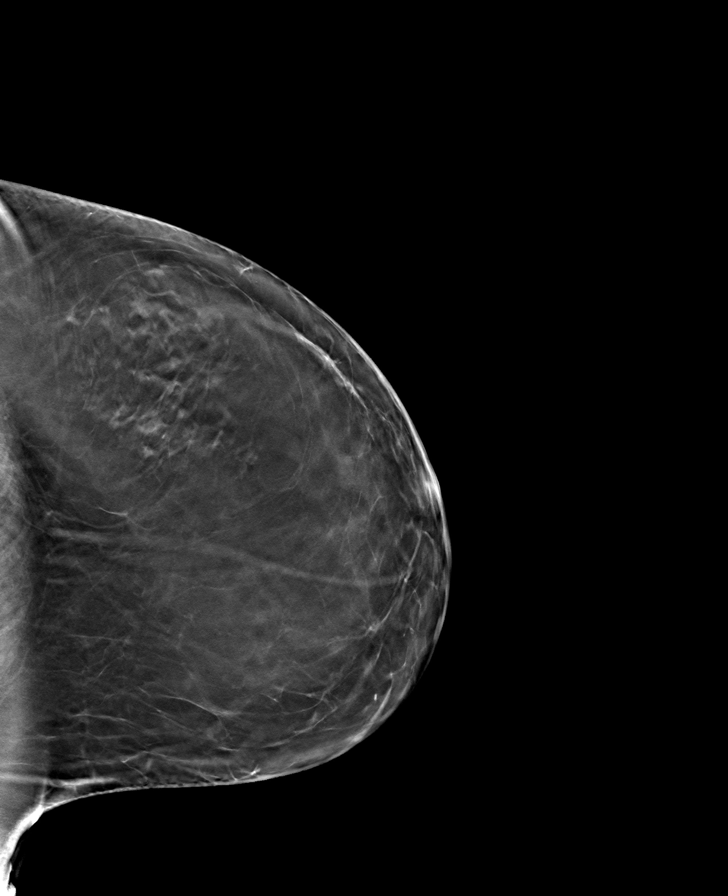

[R MLO tomo · tomo slice 37/74.0]
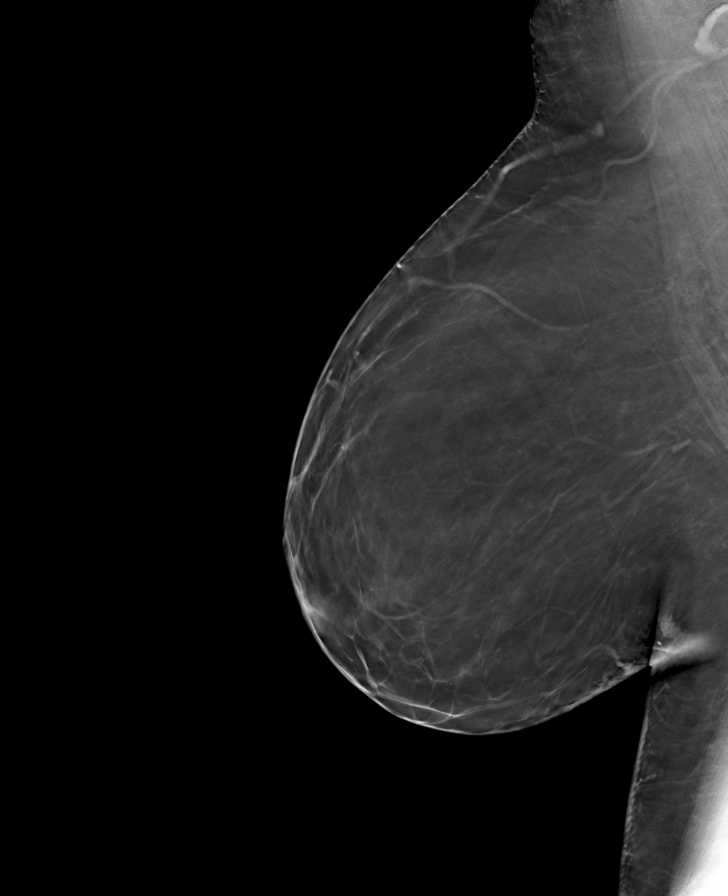

[8 of 24 positions shown; findings below may reference images not displayed]

ACR Breast Density Category b: There are scattered areas of
fibroglandular density.
FINDINGS: There are no findings suspicious for malignancy. The images were
evaluated with computer-aided detection.
IMPRESSION: No mammographic evidence of malignancy. A result letter of this
screening mammogram will be mailed directly to the patient.

RECOMMENDATION:
Screening mammogram in one year. (Code:[id number]-[id number])

BI-RADS CATEGORY  1: Negative.

## 2023-07-22 ENCOUNTER — Other Ambulatory Visit (HOSPITAL_COMMUNITY): Payer: Self-pay | Admitting: Physician Assistant

## 2023-07-22 DIAGNOSIS — Z1231 Encounter for screening mammogram for malignant neoplasm of breast: Secondary | ICD-10-CM

## 2023-08-29 ENCOUNTER — Ambulatory Visit (HOSPITAL_COMMUNITY): Payer: BC Managed Care – PPO

## 2023-09-17 DIAGNOSIS — R300 Dysuria: Secondary | ICD-10-CM | POA: Diagnosis not present

## 2023-09-17 DIAGNOSIS — I10 Essential (primary) hypertension: Secondary | ICD-10-CM | POA: Diagnosis not present

## 2023-09-17 DIAGNOSIS — R7303 Prediabetes: Secondary | ICD-10-CM | POA: Diagnosis not present

## 2023-09-17 DIAGNOSIS — E782 Mixed hyperlipidemia: Secondary | ICD-10-CM | POA: Diagnosis not present

## 2023-09-17 DIAGNOSIS — D649 Anemia, unspecified: Secondary | ICD-10-CM | POA: Diagnosis not present

## 2023-09-17 DIAGNOSIS — R319 Hematuria, unspecified: Secondary | ICD-10-CM | POA: Diagnosis not present

## 2023-09-17 DIAGNOSIS — Z1321 Encounter for screening for nutritional disorder: Secondary | ICD-10-CM | POA: Diagnosis not present

## 2023-09-24 DIAGNOSIS — E785 Hyperlipidemia, unspecified: Secondary | ICD-10-CM | POA: Diagnosis not present

## 2023-09-24 DIAGNOSIS — Z0000 Encounter for general adult medical examination without abnormal findings: Secondary | ICD-10-CM | POA: Diagnosis not present

## 2023-09-24 DIAGNOSIS — I10 Essential (primary) hypertension: Secondary | ICD-10-CM | POA: Diagnosis not present

## 2023-09-24 DIAGNOSIS — K5790 Diverticulosis of intestine, part unspecified, without perforation or abscess without bleeding: Secondary | ICD-10-CM | POA: Diagnosis not present

## 2023-09-30 DIAGNOSIS — D2261 Melanocytic nevi of right upper limb, including shoulder: Secondary | ICD-10-CM | POA: Diagnosis not present

## 2023-09-30 DIAGNOSIS — D225 Melanocytic nevi of trunk: Secondary | ICD-10-CM | POA: Diagnosis not present

## 2023-09-30 DIAGNOSIS — D2272 Melanocytic nevi of left lower limb, including hip: Secondary | ICD-10-CM | POA: Diagnosis not present

## 2023-09-30 DIAGNOSIS — D2271 Melanocytic nevi of right lower limb, including hip: Secondary | ICD-10-CM | POA: Diagnosis not present

## 2023-10-06 ENCOUNTER — Ambulatory Visit (HOSPITAL_COMMUNITY)
Admission: RE | Admit: 2023-10-06 | Discharge: 2023-10-06 | Disposition: A | Discharge status: Home / Self Care | Source: Ambulatory Visit | Attending: Physician Assistant | Admitting: Physician Assistant

## 2023-10-06 DIAGNOSIS — Z1231 Encounter for screening mammogram for malignant neoplasm of breast: Secondary | ICD-10-CM | POA: Diagnosis not present

## 2024-03-18 DIAGNOSIS — Z1329 Encounter for screening for other suspected endocrine disorder: Secondary | ICD-10-CM | POA: Diagnosis not present

## 2024-03-18 DIAGNOSIS — E1165 Type 2 diabetes mellitus with hyperglycemia: Secondary | ICD-10-CM | POA: Diagnosis not present

## 2024-03-18 DIAGNOSIS — Z130 Encounter for screening for diseases of the blood and blood-forming organs and certain disorders involving the immune mechanism: Secondary | ICD-10-CM | POA: Diagnosis not present

## 2024-03-18 DIAGNOSIS — E559 Vitamin D deficiency, unspecified: Secondary | ICD-10-CM | POA: Diagnosis not present

## 2024-03-18 DIAGNOSIS — E785 Hyperlipidemia, unspecified: Secondary | ICD-10-CM | POA: Diagnosis not present

## 2024-03-25 DIAGNOSIS — E785 Hyperlipidemia, unspecified: Secondary | ICD-10-CM | POA: Diagnosis not present

## 2024-03-25 DIAGNOSIS — I10 Essential (primary) hypertension: Secondary | ICD-10-CM | POA: Diagnosis not present

## 2024-03-25 DIAGNOSIS — K5790 Diverticulosis of intestine, part unspecified, without perforation or abscess without bleeding: Secondary | ICD-10-CM | POA: Diagnosis not present

## 2024-03-25 DIAGNOSIS — E119 Type 2 diabetes mellitus without complications: Secondary | ICD-10-CM | POA: Diagnosis not present
